# Patient Record
Sex: Male | Born: 1955 | Race: White | Hispanic: No | Marital: Married | Smoking: Never smoker
Health system: Southern US, Community
[De-identification: ages and names within clinical notes are randomized; demographics above are authoritative.]

## PROBLEM LIST (undated history)

## (undated) DIAGNOSIS — E119 Type 2 diabetes mellitus without complications: Secondary | ICD-10-CM

## (undated) DIAGNOSIS — E785 Hyperlipidemia, unspecified: Secondary | ICD-10-CM

## (undated) DIAGNOSIS — I10 Essential (primary) hypertension: Secondary | ICD-10-CM

## (undated) HISTORY — DX: Hyperlipidemia, unspecified: E78.5

## (undated) HISTORY — PX: BACK SURGERY: SHX140

## (undated) HISTORY — DX: Essential (primary) hypertension: I10

## (undated) HISTORY — PX: TONSILLECTOMY: SUR1361

## (undated) HISTORY — DX: Type 2 diabetes mellitus without complications: E11.9

## (undated) HISTORY — PX: WISDOM TOOTH EXTRACTION: SHX21

---

## 1991-08-10 HISTORY — PX: CHOLECYSTECTOMY: SHX55

## 2009-04-24 ENCOUNTER — Emergency Department (HOSPITAL_COMMUNITY): Admission: EM | Admit: 2009-04-24 | Discharge: 2009-04-24 | Payer: Self-pay | Admitting: Emergency Medicine

## 2010-11-13 LAB — COMPREHENSIVE METABOLIC PANEL
ALT: 36 U/L (ref 0–53)
AST: 41 U/L — ABNORMAL HIGH (ref 0–37)
Alkaline Phosphatase: 79 U/L (ref 39–117)
CO2: 25 mEq/L (ref 19–32)
Chloride: 104 mEq/L (ref 96–112)
GFR calc Af Amer: >60 mL/min (ref >60)
GFR calc non Af Amer: >60 mL/min (ref >60)
Potassium: 3.4 mEq/L — ABNORMAL LOW (ref 3.5–5.1)
Sodium: 138 mEq/L (ref 135–145)
Total Bilirubin: 1.1 mg/dL (ref 0.3–1.2)

## 2010-11-13 LAB — DIFFERENTIAL
Basophils Absolute: 0 10*3/uL (ref 0.0–0.1)
Basophils Relative: 0 % (ref 0–1)
Eosinophils Absolute: 0.1 K/uL (ref 0.0–0.7)
Eosinophils Relative: 1 % (ref 0–5)
Lymphocytes Relative: 18 % (ref 12–46)
Lymphs Abs: 1.3 10*3/uL (ref 0.7–4.0)
Monocytes Absolute: 0.4 10*3/uL (ref 0.1–1.0)
Monocytes Relative: 5 % (ref 3–12)
Neutro Abs: 5.6 10*3/uL (ref 1.7–7.7)
Neutrophils Relative %: 76 % (ref 43–77)

## 2010-11-13 LAB — URINALYSIS, ROUTINE W REFLEX MICROSCOPIC
Bilirubin Urine: NEGATIVE
Glucose, UA: 100 mg/dL — AB
Ketones, ur: NEGATIVE mg/dL
Leukocytes, UA: NEGATIVE
Nitrite: NEGATIVE
Protein, ur: NEGATIVE mg/dL
Specific Gravity, Urine: 1.012 (ref 1.005–1.030)
Urobilinogen, UA: 0.2 mg/dL (ref 0.0–1.0)
pH: 6.5 (ref 5.0–8.0)

## 2010-11-13 LAB — COMPREHENSIVE METABOLIC PANEL WITH GFR
Albumin: 4.5 g/dL (ref 3.5–5.2)
BUN: 13 mg/dL (ref 6–23)
Calcium: 8.7 mg/dL (ref 8.4–10.5)
Creatinine, Ser: 0.92 mg/dL (ref 0.4–1.5)
Glucose, Bld: 168 mg/dL — ABNORMAL HIGH (ref 70–99)
Total Protein: 6.5 g/dL (ref 6.0–8.3)

## 2010-11-13 LAB — CBC
HCT: 46 % (ref 39.0–52.0)
Hemoglobin: 16.5 g/dL (ref 13.0–17.0)
MCHC: 35.8 g/dL (ref 30.0–36.0)
MCV: 90.3 fL (ref 78.0–100.0)
Platelets: 203 K/uL (ref 150–400)
RBC: 5.09 MIL/uL (ref 4.22–5.81)
RDW: 12.8 % (ref 11.5–15.5)
WBC: 7.4 10*3/uL (ref 4.0–10.5)

## 2010-11-13 LAB — SAMPLE TO BLOOD BANK

## 2010-11-13 LAB — LIPASE, BLOOD: Lipase: 25 U/L (ref 11–59)

## 2010-11-13 LAB — URINE MICROSCOPIC-ADD ON

## 2011-01-26 IMAGING — CT CT ABDOMEN W/ CM
1 of 2 series · 15 of 32 positions shown, 19 images · IV contrast (agent unspecified)
Comparison: None

CT ABDOMEN

CLINICAL DATA: Lower abdominal pain.  Nausea and vomiting.
Diaphoresis.

CT ABDOMEN AND PELVIS WITH CONTRAST
TECHNIQUE: Multidetector CT imaging of the abdomen and pelvis was
performed using the standard protocol following bolus
administration of intravenous contrast.
Contrast: 100 ml Jmnipaque-ZBB

[Series 2: rtn ap with st · axial · 0.74mm/px · z∈[-140,+316]mm · 15 of 101 slices shown, 19 images]
[im 5/101  soft-tissue]
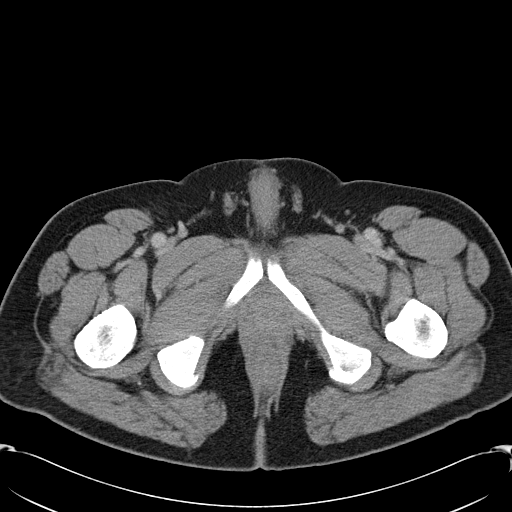
[im 5/101  bone]
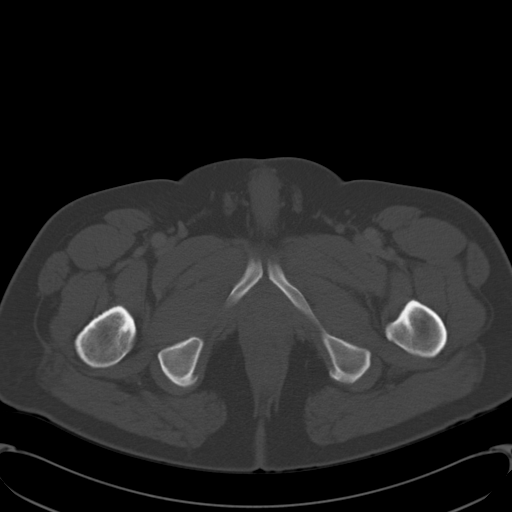
[im 14/101  soft-tissue]
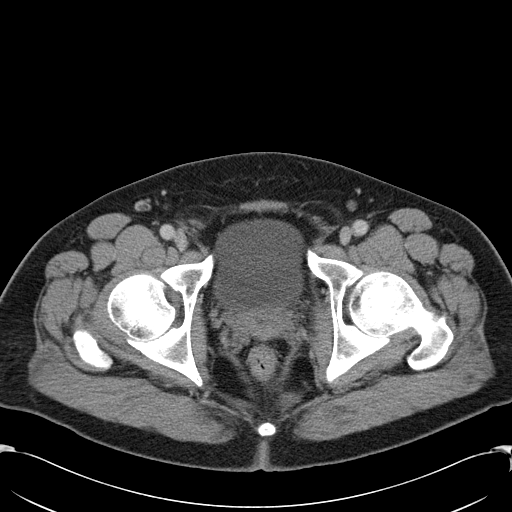
[im 23/101  soft-tissue]
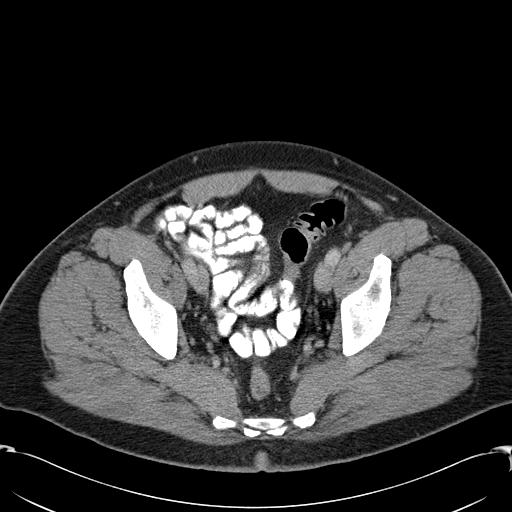
[im 28/101  soft-tissue]
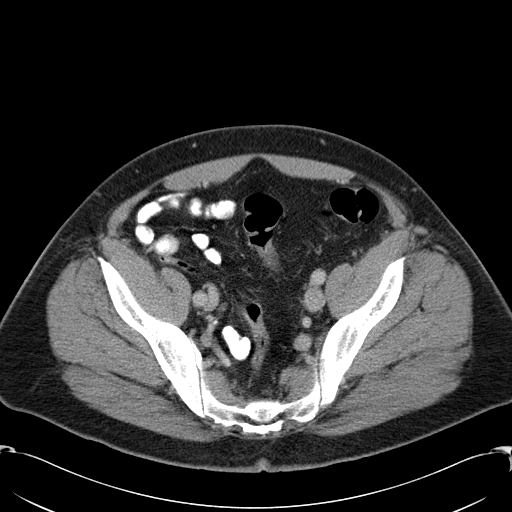
[im 37/101  soft-tissue]
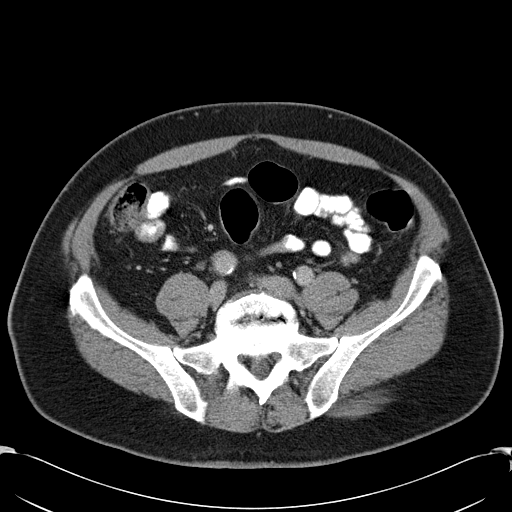
[im 41/101  soft-tissue]
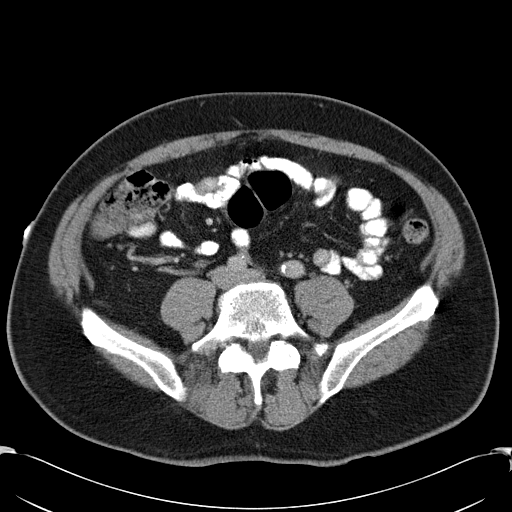
[im 51/101  soft-tissue]
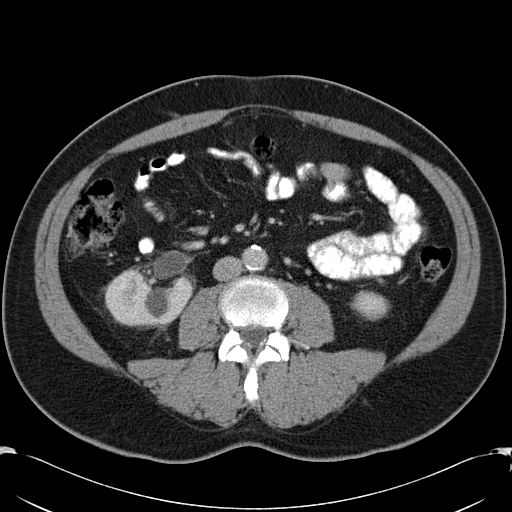
[im 60/101  soft-tissue]
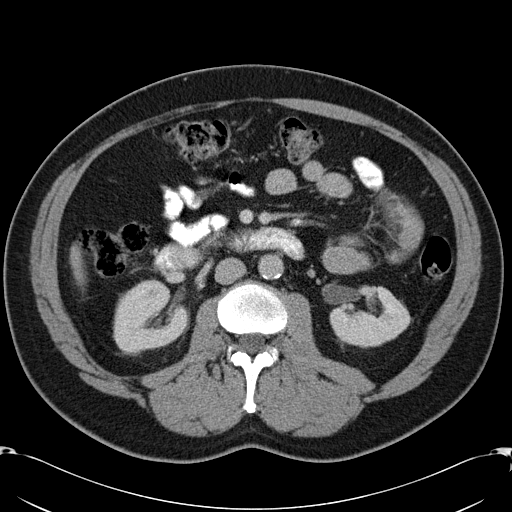
[im 64/101  soft-tissue]
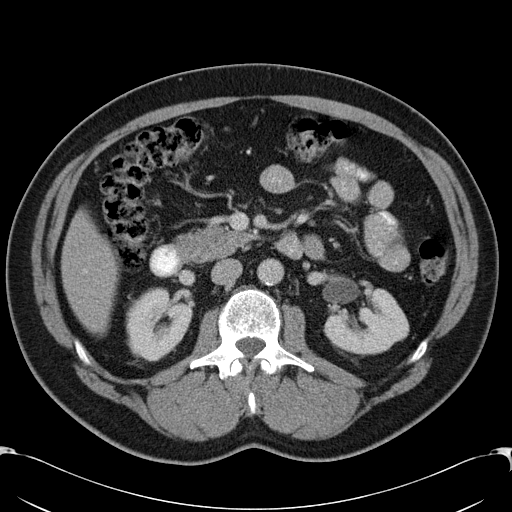
[im 64/101  bone]
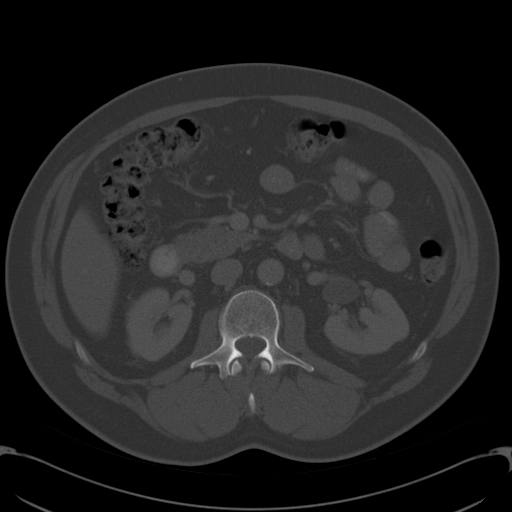
[im 73/101  soft-tissue]
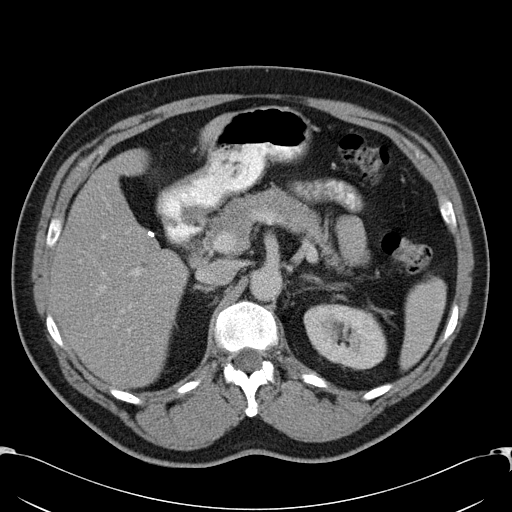
[im 78/101  soft-tissue]
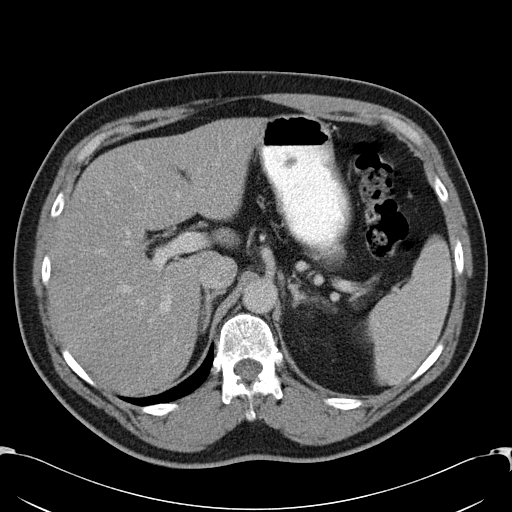
[im 82/101  lung]
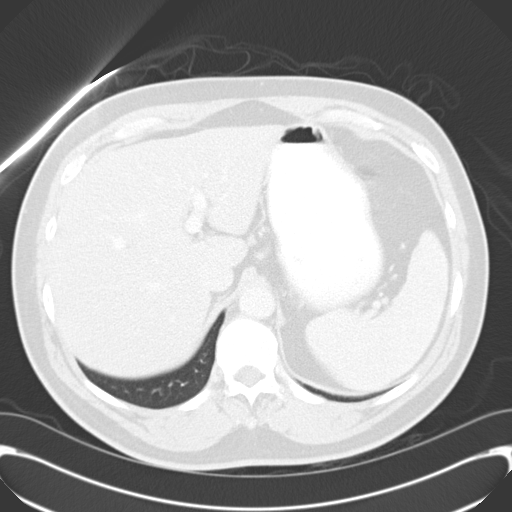
[im 87/101  soft-tissue]
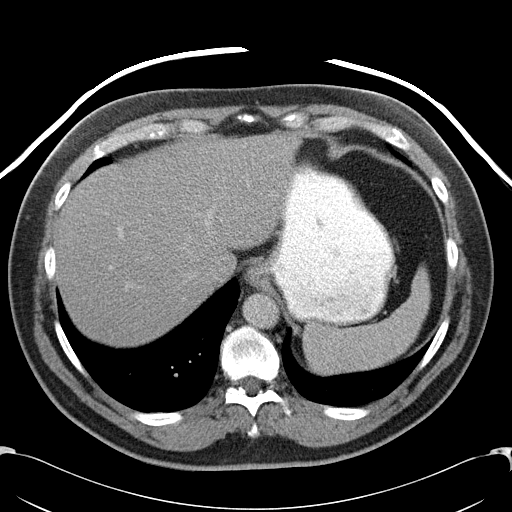
[im 87/101  lung]
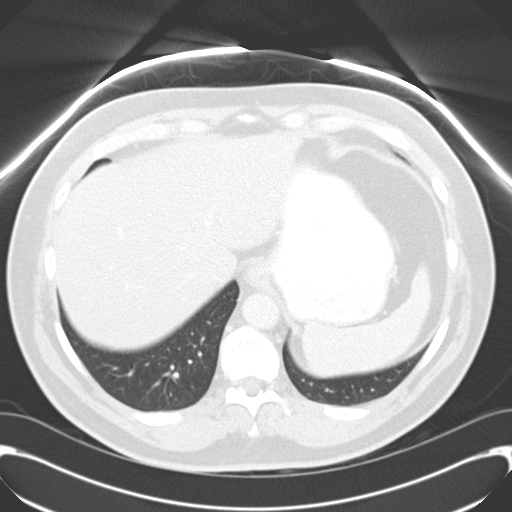
[im 91/101  lung]
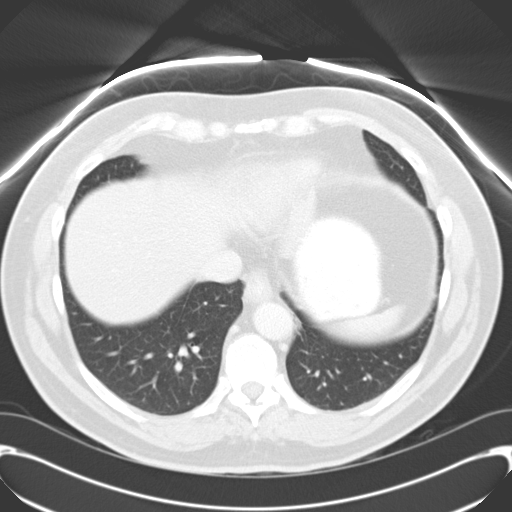
[im 96/101  soft-tissue]
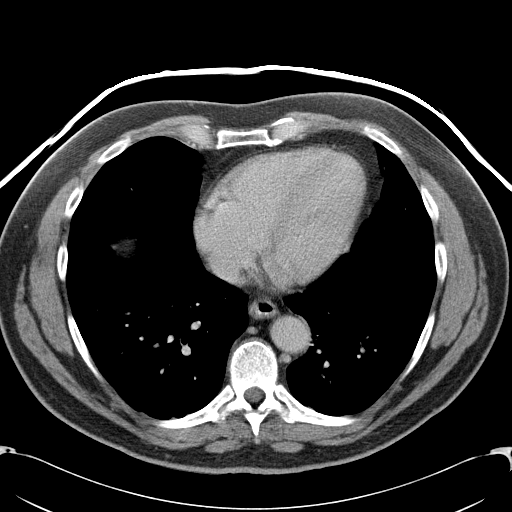
[im 96/101  lung]
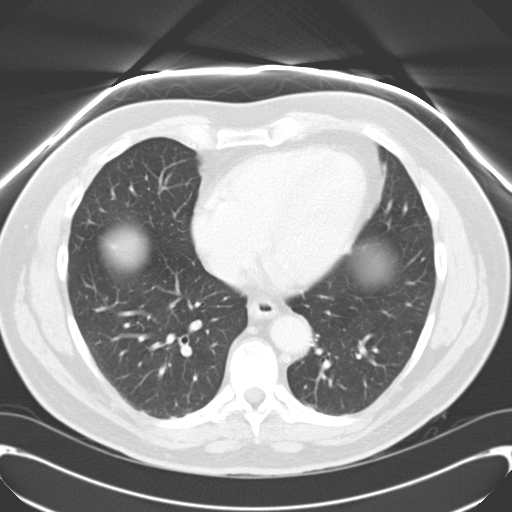

[15 of 32 positions shown; findings below may reference images not displayed]

FINDINGS: A small amount of contrast medium in the distal
esophagus suggests gastroesophageal reflux.

Mild hepatic steatosis noted.  The spleen, adrenal glands, and
pancreas appear unremarkable.  Bilateral extrarenal pelvis noted
with abnormal stranding around the right collecting system and mild
enhancement of the walls of the right renal pelvis suggesting
inflammation.  There is borderline right hydronephrosis, but
without significant abnormal renal enhancement.

A right lower pole cyst measures 2.1 x 1.7 cm on image 51 of series
#2.

There is mild right hydroureter which extends down to a 2 mm right
ureterovesical junction calculus.

No dilated bowel noted.

The gallbladder surgically absent.

No pathologic retroperitoneal or porta hepatis adenopathy is
identified.

A small umbilical hernia contains adipose tissue.
IMPRESSION: 1.  Right hydronephrosis and hydroureter extending down to a 2 mm
mildly obstructive ureterovesical junction calculus.  There is
asymmetric stranding around the right collecting system, and mild
forniceal rupture cannot be excluded.
2.  Mild hepatic steatosis.

CT PELVIS
FINDINGS: The appendix appears normal.  Mild right hydroureter
extends down to a 2 mm right ureterovesical junction calculus.

Urinary bladder appears unremarkable, as does the left ureter.

No pathologic pelvic adenopathy is identified.

No dilated bowel or discrete bowel abnormality is identified.
IMPRESSION: 1.  Mildly obstructive right sided 2 mm ureterovesical junction
calculus.

## 2013-08-07 ENCOUNTER — Encounter: Payer: Self-pay | Admitting: Internal Medicine

## 2013-08-17 ENCOUNTER — Ambulatory Visit (AMBULATORY_SURGERY_CENTER): Payer: Self-pay

## 2013-08-17 VITALS — Ht 71.0 in | Wt 214.6 lb

## 2013-08-17 DIAGNOSIS — Z800 Family history of malignant neoplasm of digestive organs: Secondary | ICD-10-CM

## 2013-08-17 MED ORDER — MOVIPREP 100 G PO SOLR: ORAL | Status: DC

## 2013-08-31 ENCOUNTER — Ambulatory Visit (AMBULATORY_SURGERY_CENTER): Payer: No Typology Code available for payment source | Admitting: Internal Medicine

## 2013-08-31 ENCOUNTER — Encounter: Payer: Self-pay | Admitting: Internal Medicine

## 2013-08-31 VITALS — BP 147/100 | HR 67 | Temp 98.1°F | Resp 16 | Ht 71.0 in | Wt 214.0 lb

## 2013-08-31 DIAGNOSIS — D126 Benign neoplasm of colon, unspecified: Secondary | ICD-10-CM

## 2013-08-31 DIAGNOSIS — Z1211 Encounter for screening for malignant neoplasm of colon: Secondary | ICD-10-CM

## 2013-08-31 DIAGNOSIS — Z800 Family history of malignant neoplasm of digestive organs: Secondary | ICD-10-CM

## 2013-08-31 MED ORDER — SODIUM CHLORIDE 0.9 % IV SOLN: 500.0000 mL | INTRAVENOUS | Status: DC

## 2013-08-31 NOTE — Progress Notes (Signed)
Procedure ends, to recovery, report given and VSS. 

## 2013-08-31 NOTE — Patient Instructions (Signed)
YOU HAD AN ENDOSCOPIC PROCEDURE TODAY AT THE [facility]: Refer to the procedure report that was given to you for any specific questions about what was found during the examination.  If the procedure report does not answer your questions, please call your gastroenterologist to clarify.  If you requested that your care partner not be given the details of your procedure findings, then the procedure report has been included in a sealed envelope for you to review at your convenience later.  YOU SHOULD EXPECT: Some feelings of bloating in the abdomen. Passage of more gas than usual.  Walking can help get rid of the air that was put into your GI tract during the procedure and reduce the bloating. If you had a lower endoscopy (such as a colonoscopy or flexible sigmoidoscopy) you may notice spotting of blood in your stool or on the toilet paper. If you underwent a bowel prep for your procedure, then you may not have a normal bowel movement for a few days.  DIET: Your first meal following the procedure should be a light meal and then it is ok to progress to your normal diet.  A half-sandwich or bowl of soup is an example of a good first meal.  Heavy or fried foods are harder to digest and may make you feel nauseous or bloated.  Likewise meals heavy in dairy and vegetables can cause extra gas to form and this can also increase the bloating.  Drink plenty of fluids but you should avoid alcoholic beverages for 24 hours.  ACTIVITY: Your care partner should take you home directly after the procedure.  You should plan to take it easy, moving slowly for the rest of the day.  You can resume normal activity the day after the procedure however you should NOT DRIVE or use heavy machinery for 24 hours (because of the sedation medicines used during the test).    SYMPTOMS TO REPORT IMMEDIATELY: A gastroenterologist can be reached at any hour.  During normal business hours, 8:30 AM to 5:00 PM Monday through Friday,  call [phone].  After hours and on weekends, please call the GI answering service at [phone] who will take a message and have the physician on call contact you.   Following lower endoscopy (colonoscopy or flexible sigmoidoscopy):  Excessive amounts of blood in the stool  Significant tenderness or worsening of abdominal pains  Swelling of the abdomen that is new, acute  Fever of 100F or higher  FOLLOW UP: If any biopsies were taken you will be contacted by phone or by letter within the next 1-3 weeks.  Call your gastroenterologist if you have not heard about the biopsies in 3 weeks.  Our staff will call the home number listed on your records the next business day following your procedure to check on you and address any questions or concerns that you may have at that time regarding the information given to you following your procedure. This is a courtesy call and so if there is no answer at the home number and we have not heard from you through the emergency physician on call, we will assume that you have returned to your regular daily activities without incident.  SIGNATURES/CONFIDENTIALITY: You and/or your care partner have signed paperwork which will be entered into your electronic medical record.  These signatures attest to the fact that that the information above on your After Visit Summary has been reviewed and is understood.  Full responsibility of the confidentiality of this   discharge information lies with you and/or your care-partner.  Recommendations Await pathology results Timing of next colonoscopy will be determined by pathology results

## 2013-08-31 NOTE — Op Note (Signed)
[facility]  [address] [place], [address]   COLONOSCOPY PROCEDURE REPORT  PATIENT: Colin Moss, Colin Moss  MR#: [id number] BIRTHDATE: [birth year] , 54  yrs. old GENDER: Male ENDOSCOPIST: Jerene Bears, MD REFERRED NK:[id number] Reynaldo Minium, M.D. PROCEDURE DATE:  08/31/2013 PROCEDURE:   Colonoscopy with cold biopsy polypectomy First Screening Colonoscopy - Avg.  risk and is 50 yrs.  old or older Yes.  Prior Negative Screening - Now for repeat screening. N/A  History of Adenoma - Now for follow-up colonoscopy & has been > or = to 3 yrs.  N/A  Polyps Removed Today? Yes. ASA CLASS:   Class II INDICATIONS:elevated risk screening, Patient's immediate family history of colon cancer, and first colonoscopy. MEDICATIONS: MAC sedation, administered by CRNA and propofol (Diprivan) 330mg  IV  DESCRIPTION OF PROCEDURE:   After the risks benefits and alternatives of the procedure were thoroughly explained, informed consent was obtained.  A digital rectal exam revealed no rectal mass.   The LB [id number]  endoscope was introduced through the anus and advanced to the terminal ileum which was intubated for a short distance. No adverse events experienced.   The quality of the prep was good, using MoviPrep  The instrument was then slowly withdrawn as the colon was fully examined.   COLON FINDINGS: The mucosa appeared normal in the terminal ileum. Three sessile polyps ranging between 3-87mm in size were found in the transverse colon (2) and rectosigmoid colon (1).  Polypectomy was performed with cold forceps.  All resections were complete and all polyp tissue was completely retrieved.  Retroflexed views revealed small external hemorrhoids. The time to cecum=4 minutes 41 seconds.  Withdrawal time=13 minutes 45 seconds.  The scope was withdrawn and the procedure completed. COMPLICATIONS: There were no complications.  ENDOSCOPIC IMPRESSION: 1.   Normal mucosa in the terminal ileum 2.    Three, small, sessile polyps ranging between 3-31mm in size were found in the transverse colon and rectosigmoid colon; Polypectomy was performed with cold forceps  RECOMMENDATIONS: 1.  Await pathology results 2.  Timing of repeat colonoscopy will be determined by pathology findings. 3.  You will receive a letter within 1-2 weeks with the results of your biopsy as well as final recommendations.  Please call my office if you have not received a letter after 3 weeks.   eSigned:  Jerene Bears, MD 08/31/2013 8:38 AM   cc: The Patient and Burnard Bunting, MD

## 2013-08-31 NOTE — Progress Notes (Signed)
Called to room to assist during endoscopic procedure.  Patient ID and intended procedure confirmed with present staff. Received instructions for my participation in the procedure from the performing physician.  

## 2013-09-03 ENCOUNTER — Telehealth: Payer: Self-pay | Admitting: *Deleted

## 2013-09-03 NOTE — Telephone Encounter (Signed)
  Follow up Call-  Call back number 08/31/2013  Post procedure Call Back phone  # [phone]  Permission to leave phone message Yes     Patient questions:  Do you have a fever, pain , or abdominal swelling? no Pain Score  0 *  Have you tolerated food without any problems? yes  Have you been able to return to your normal activities? yes  Do you have any questions about your discharge instructions: Diet   no Medications  no Follow up visit  no  Do you have questions or concerns about your Care? no  Actions: * If pain score is 4 or above: No action needed, pain <4.

## 2013-09-04 ENCOUNTER — Encounter: Payer: Self-pay | Admitting: Internal Medicine

## 2015-06-25 ENCOUNTER — Other Ambulatory Visit: Payer: Self-pay | Admitting: Physician Assistant

## 2015-06-30 ENCOUNTER — Other Ambulatory Visit: Payer: Self-pay | Admitting: Emergency Medicine

## 2015-06-30 DIAGNOSIS — J3089 Other allergic rhinitis: Secondary | ICD-10-CM

## 2015-06-30 MED ORDER — FLUTICASONE PROPIONATE 50 MCG/ACT NA SUSP: 2.0000 | Freq: Every day | NASAL | Status: DC

## 2015-06-30 NOTE — Telephone Encounter (Signed)
Med refill approved 

## 2015-06-30 NOTE — Telephone Encounter (Signed)
Received a faxed medication request from [facility] in [place].  Please advise.  Thank you.

## 2015-07-24 ENCOUNTER — Ambulatory Visit: Payer: Self-pay | Admitting: Physician Assistant

## 2015-07-24 ENCOUNTER — Encounter: Payer: Self-pay | Admitting: Physician Assistant

## 2015-07-24 DIAGNOSIS — H01006 Unspecified blepharitis left eye, unspecified eyelid: Secondary | ICD-10-CM

## 2015-07-24 MED ORDER — NEOMYCIN-POLYMYXIN-DEXAMETH 0.1 % OP OINT: 1.0000 "application " | TOPICAL_OINTMENT | Freq: Three times a day (TID) | OPHTHALMIC | Status: DC

## 2015-07-24 NOTE — Progress Notes (Signed)
S: c/o itchy on upper lid, some redness, no drainage or pain, no fever/chills/trauma  O: nad, neuro intact, left upper lid inflamed, no swelling or crusting, perrl eomi, neck supple no lymph  A: blepharitis  P: neomycin opth ointment

## 2015-10-24 ENCOUNTER — Encounter: Payer: Self-pay | Admitting: Physician Assistant

## 2015-10-24 ENCOUNTER — Ambulatory Visit: Payer: Self-pay | Admitting: Physician Assistant

## 2015-10-24 VITALS — BP 120/80 | HR 70 | Temp 98.2°F

## 2015-10-24 DIAGNOSIS — L309 Dermatitis, unspecified: Secondary | ICD-10-CM

## 2015-10-24 MED ORDER — MOMETASONE FUROATE 0.1 % EX CREA: 1.0000 "application " | TOPICAL_CREAM | Freq: Every day | CUTANEOUS | Status: DC

## 2015-10-24 NOTE — Progress Notes (Signed)
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

## 2016-07-04 ENCOUNTER — Other Ambulatory Visit: Payer: Self-pay | Admitting: Physician Assistant

## 2016-07-04 DIAGNOSIS — J3089 Other allergic rhinitis: Secondary | ICD-10-CM

## 2016-08-13 ENCOUNTER — Ambulatory Visit (INDEPENDENT_AMBULATORY_CARE_PROVIDER_SITE_OTHER): Payer: Managed Care, Other (non HMO) | Admitting: Orthopaedic Surgery

## 2016-08-13 ENCOUNTER — Encounter (INDEPENDENT_AMBULATORY_CARE_PROVIDER_SITE_OTHER): Payer: Self-pay | Admitting: Orthopaedic Surgery

## 2016-08-13 VITALS — Ht 70.5 in | Wt 199.0 lb

## 2016-08-13 DIAGNOSIS — M79605 Pain in left leg: Secondary | ICD-10-CM | POA: Diagnosis not present

## 2016-08-13 NOTE — Progress Notes (Signed)
   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 I suspect that there may been some injury to the peroneal nerve at the time of his fall when he injured his right ankle a month ago.  Plan ; continue with anti-inflammatory medicines. Avoid compression along the lateral aspect of left leg. Follow-up in 2-4 weeks if no improvement or symptoms worsen. Might consider MRI scan of left leg if no improvement to evaluate any compression of the peroneal nerve along the fibular head  Follow-Up Instructions: No Follow-up on file.   Orders:  No orders of the defined types were placed in this encounter.  No orders of the defined types were placed in this encounter.     Procedures: No procedures performed   Clinical Data: No additional findings.   Subjective: No chief complaint on file.   On 07/16/16, pt twisted right ankle. He went to see county Therapist, sports.  10 days later his left calf hurting and cramping at night. Tingling in Left great toe.  Taking advil, with some relief.   Denies any weakness with dorsiflexion or inversion of his left foot. He sustained a bruise along the anterior left tibia after the onset of his symptoms. He has experienced some pain along the lateral aspect of his left leg with tingling in the left great toe.  Review of Systems   Objective: Vital Signs: There were no vitals taken for this visit.  Physical Exam  Ortho Exam straight leg raise negative bilaterally. Positive Tinel's over the peroneal nerve approximately an inch distal to the left fibular head. No swelling. Resolving ecchymosis along the anterior tibia from a recent bruise. Decreased active  dorsiflexion of great toe compared to right without evidence of hallux rigidus. Fungal infection of left great toenail without redness. Normal strength in dorsiflexion of ankle and eversion of left foot. No pain with range of motion of left hip or left knee.  Specialty Comments:  No specialty comments available.  Imaging: No results found.   PMFS History: There are no active problems to display for this patient.  Past Medical History:  Diagnosis Date  . Hypertension     Family History  Problem Relation Age of Onset  . Ovarian cancer Mother   . Colon cancer Father   . Heart disease Father     Past Surgical History:  Procedure Laterality Date  . BACK SURGERY     ruptured disc l-4  . CHOLECYSTECTOMY  1993  . TONSILLECTOMY    . WISDOM TOOTH EXTRACTION     Social History   Occupational History  . Not on file.   Social History Main Topics  . Smoking status: Never Smoker  . Smokeless tobacco: Never Used  . Alcohol use Yes     Comment: occasional beer   . Drug use: No  . Sexual activity: Not on file

## 2017-02-14 ENCOUNTER — Ambulatory Visit: Payer: Self-pay | Admitting: Physician Assistant

## 2017-02-14 ENCOUNTER — Encounter: Payer: Self-pay | Admitting: Physician Assistant

## 2017-02-14 VITALS — BP 149/80 | HR 65 | Temp 98.5°F | Resp 16

## 2017-02-14 DIAGNOSIS — T63301A Toxic effect of unspecified spider venom, accidental (unintentional), initial encounter: Secondary | ICD-10-CM

## 2017-02-14 MED ORDER — MUPIROCIN 2 % EX OINT: TOPICAL_OINTMENT | CUTANEOUS | 0 refills | Status: DC

## 2017-02-14 NOTE — Progress Notes (Signed)
S: was working out near [place] wood [place] and noticed a bite on his leg, worried its a brown recluse spider bite, no fever/chills/spasms, been using h202 on area and antibiotic ointment  O: vitals wnl, nad, skin with nickel sized red scaly area with brown center, no drainage, no necroses, n/v intact  A: spider bite  P: bactroban ointment

## 2018-07-19 ENCOUNTER — Encounter: Payer: Self-pay | Admitting: Internal Medicine

## 2018-08-15 ENCOUNTER — Ambulatory Visit (AMBULATORY_SURGERY_CENTER): Payer: Self-pay

## 2018-08-15 VITALS — Ht 72.0 in | Wt 202.2 lb

## 2018-08-15 DIAGNOSIS — Z86010 Personal history of colonic polyps: Secondary | ICD-10-CM

## 2018-08-15 MED ORDER — NA SULFATE-K SULFATE-MG SULF 17.5-3.13-1.6 GM/177ML PO SOLN: 1.0000 | Freq: Once | ORAL | 0 refills | Status: AC

## 2018-08-15 NOTE — Progress Notes (Signed)
Denies allergies to eggs or soy products. Denies complication of anesthesia or sedation. Denies use of weight loss medication. Denies use of O2.   Emmi instructions declined.  

## 2018-08-17 ENCOUNTER — Encounter: Payer: Self-pay | Admitting: Internal Medicine

## 2018-08-25 ENCOUNTER — Encounter: Payer: Self-pay | Admitting: Internal Medicine

## 2018-08-28 ENCOUNTER — Ambulatory Visit (AMBULATORY_SURGERY_CENTER): Payer: Managed Care, Other (non HMO) | Admitting: Internal Medicine

## 2018-08-28 ENCOUNTER — Encounter: Payer: Self-pay | Admitting: Internal Medicine

## 2018-08-28 VITALS — BP 148/86 | HR 71 | Temp 97.8°F | Resp 13 | Ht 72.0 in | Wt 202.0 lb

## 2018-08-28 DIAGNOSIS — Z86010 Personal history of colonic polyps: Secondary | ICD-10-CM

## 2018-08-28 DIAGNOSIS — D123 Benign neoplasm of transverse colon: Secondary | ICD-10-CM

## 2018-08-28 MED ORDER — SODIUM CHLORIDE 0.9 % IV SOLN: 500.0000 mL | Freq: Once | INTRAVENOUS | Status: DC

## 2018-08-28 NOTE — Progress Notes (Signed)
Pt's states no medical or surgical changes since previsit or office visit. 

## 2018-08-28 NOTE — Progress Notes (Signed)
Called to room to assist during endoscopic procedure.  Patient ID and intended procedure confirmed with present staff. Received instructions for my participation in the procedure from the performing physician.  

## 2018-08-28 NOTE — Progress Notes (Signed)
A/ox3, pleased with MAC, report to RN 

## 2018-08-28 NOTE — Op Note (Signed)
[facility] Patient Name: Colin Moss Procedure Date: 08/28/2018 8:44 AM MRN: [id number] Endoscopist: Jerene Bears , MD Age: 63 Referring MD:  Date of Birth: Feb 14, 1956 Gender: Male Account #: [id number] Procedure:                Colonoscopy Indications:              Surveillance: Personal history of adenomatous                            polyps on last colonoscopy 5 years ago, Family                            history of colon cancer in a first-degree relative Medicines:                Monitored Anesthesia Care Procedure:                Pre-Anesthesia Assessment:                           - Prior to the procedure, a History and Physical                            was performed, and patient medications and                            allergies were reviewed. The patient's tolerance of                            previous anesthesia was also reviewed. The risks                            and benefits of the procedure and the sedation                            options and risks were discussed with the patient.                            All questions were answered, and informed consent                            was obtained. Prior Anticoagulants: The patient has                            taken no previous anticoagulant or antiplatelet                            agents. ASA Grade Assessment: II - A patient with                            mild systemic disease. After reviewing the risks                            and benefits, the patient was deemed in  satisfactory condition to undergo the procedure.                           After obtaining informed consent, the colonoscope                            was passed under direct vision. Throughout the                            procedure, the patient's blood pressure, pulse, and                            oxygen saturations were monitored continuously. The                            Colonoscope was  introduced through the anus and                            advanced to the terminal ileum. The colonoscopy was                            performed without difficulty. The patient tolerated                            the procedure well. The quality of the bowel                            preparation was good. The terminal ileum, ileocecal                            valve, appendiceal orifice, and rectum were                            photographed. Scope In: 8:54:57 AM Scope Out: 9:12:28 AM Scope Withdrawal Time: 0 hours 14 minutes 19 seconds  Total Procedure Duration: 0 hours 17 minutes 31 seconds  Findings:                 The digital rectal exam was normal.                           A 5 mm polyp was found in the transverse colon. The                            polyp was sessile. The polyp was removed with a                            cold snare. Resection and retrieval were complete.                           Multiple small-mouthed diverticula were found in                            the sigmoid colon.  Internal hemorrhoids were found during                            retroflexion. The hemorrhoids were small. Complications:            No immediate complications. Estimated Blood Loss:     Estimated blood loss was minimal. Impression:               - One 5 mm polyp in the transverse colon, removed                            with a cold snare. Resected and retrieved.                           - Diverticulosis in the sigmoid colon.                           - Internal hemorrhoids. Recommendation:           - Patient has a contact number available for                            emergencies. The signs and symptoms of potential                            delayed complications were discussed with the                            patient. Return to normal activities tomorrow.                            Written discharge instructions were provided to the                             patient.                           - Resume previous diet.                           - Continue present medications.                           - Await pathology results.                           - Repeat colonoscopy in 5 years for surveillance. Jerene Bears, MD 08/28/2018 9:15:52 AM This report has been signed electronically.

## 2018-08-28 NOTE — Patient Instructions (Signed)
   Information on polyps diverticulosis ,& hemorrhoids given to you today  Await pathology results   YOU HAD AN ENDOSCOPIC PROCEDURE TODAY AT [facility]:   Refer to the procedure report that was given to you for any specific questions about what was found during the examination.  If the procedure report does not answer your questions, please call your gastroenterologist to clarify.  If you requested that your care partner not be given the details of your procedure findings, then the procedure report has been included in a sealed envelope for you to review at your convenience later.  YOU SHOULD EXPECT: Some feelings of bloating in the abdomen. Passage of more gas than usual.  Walking can help get rid of the air that was put into your GI tract during the procedure and reduce the bloating. If you had a lower endoscopy (such as a colonoscopy or flexible sigmoidoscopy) you may notice spotting of blood in your stool or on the toilet paper. If you underwent a bowel prep for your procedure, you may not have a normal bowel movement for a few days.  Please Note:  You might notice some irritation and congestion in your nose or some drainage.  This is from the oxygen used during your procedure.  There is no need for concern and it should clear up in a day or so.  SYMPTOMS TO REPORT IMMEDIATELY:   Following lower endoscopy (colonoscopy or flexible sigmoidoscopy):  Excessive amounts of blood in the stool  Significant tenderness or worsening of abdominal pains  Swelling of the abdomen that is new, acute  Fever of 100F or higher    For urgent or emergent issues, a gastroenterologist can be reached at any hour by calling [phone].   DIET:  We do recommend a small meal at first, but then you may proceed to your regular diet.  Drink plenty of fluids but you should avoid alcoholic beverages for 24 hours.  ACTIVITY:  You should plan to take it easy for the rest of today and you should  NOT DRIVE or use heavy machinery until tomorrow (because of the sedation medicines used during the test).    FOLLOW UP: Our staff will call the number listed on your records the next business day following your procedure to check on you and address any questions or concerns that you may have regarding the information given to you following your procedure. If we do not reach you, we will leave a message.  However, if you are feeling well and you are not experiencing any problems, there is no need to return our call.  We will assume that you have returned to your regular daily activities without incident.  If any biopsies were taken you will be contacted by phone or by letter within the next 1-3 weeks.  Please call us at [phone] if you have not heard about the biopsies in 3 weeks.    SIGNATURES/CONFIDENTIALITY: You and/or your care partner have signed paperwork which will be entered into your electronic medical record.  These signatures attest to the fact that that the information above on your After Visit Summary has been reviewed and is understood.  Full responsibility of the confidentiality of this discharge information lies with you and/or your care-partner.

## 2018-08-29 ENCOUNTER — Telehealth: Payer: Self-pay

## 2018-08-29 NOTE — Telephone Encounter (Signed)
  Follow up Call-  Call back number 08/28/2018  Post procedure Call Back phone  # [phone]  Permission to leave phone message Yes  Some recent data might be hidden     Left message on voicemail

## 2018-08-29 NOTE — Telephone Encounter (Signed)
Left message on f/u call 

## 2018-09-06 ENCOUNTER — Encounter: Payer: Self-pay | Admitting: Internal Medicine

## 2018-09-07 ENCOUNTER — Ambulatory Visit: Payer: Self-pay | Admitting: Adult Health

## 2018-09-07 VITALS — BP 160/80 | HR 67 | Temp 97.8°F | Resp 14

## 2018-09-07 DIAGNOSIS — Z0100 Encounter for examination of eyes and vision without abnormal findings: Secondary | ICD-10-CM

## 2018-09-07 DIAGNOSIS — H02843 Edema of right eye, unspecified eyelid: Secondary | ICD-10-CM

## 2018-09-07 DIAGNOSIS — H00014 Hordeolum externum left upper eyelid: Secondary | ICD-10-CM

## 2018-09-07 DIAGNOSIS — H01001 Unspecified blepharitis right upper eyelid: Secondary | ICD-10-CM

## 2018-09-07 MED ORDER — DOXYCYCLINE HYCLATE 100 MG PO TABS: 100.0000 mg | ORAL_TABLET | Freq: Two times a day (BID) | ORAL | 0 refills | Status: DC

## 2018-09-07 MED ORDER — BACITRACIN-POLYMYXIN B 500-10000 UNIT/GM OP OINT: 1.0000 "application " | TOPICAL_OINTMENT | Freq: Three times a day (TID) | OPHTHALMIC | 0 refills | Status: AC

## 2018-09-07 NOTE — Patient Instructions (Addendum)
Please follow up with your eye doctor or your primary care physician if and symptoms change or worsen. Advised patient call the office or your primary care doctor for an appointment if no improvement within 72 hours or if any symptoms change or worsen at any time  Advised ER or urgent Care if after hours or on weekend. Call 911 for emergency symptoms at any time.Patinet verbalized understanding of all instructions given/reviewed and treatment plan and has no further questions or concerns at this time.    Blepharitis Blepharitis is swelling of the eyelids. Symptoms may include:  Reddish, scaly skin around the scalp and eyebrows.  Burning or itching of the eyelids.  Fluid coming from the eye at night. This causes the eyelashes to stick together in the morning.  Eyelashes that fall out.  Being sensitive to light. Follow these instructions at home: Pay attention to any changes in how you look or feel. Tell your health care provider about any changes. Follow these instructions to help with your condition: Keeping clean   Wash your hands often.  Wash your eyelids with warm water, or wash them with warm water that is mixed with little bit of baby shampoo. Do this 2 or more times per day.  Wash your face and eyebrows at least once a day.  Use a clean towel each time you dry your eyelids. Do not use the towel to clean or dry other areas of your body. Do not share your towel with anyone. General instructions  Avoid wearing makeup until you get better. Do not share makeup with anyone.  Avoid rubbing your eyes.  Put a warm compress on your eyes 2 times per day for 10 minutes at a time, or as told by your doctor.  If you were given antibiotics in the form of creams or eye drops, use the medicine as told by your doctor. Do not stop using the medicine even if you feel better.  Keep all follow-up visits as told by your doctor. This is important. Contact a doctor if:  Your eyelids feel  hot.  You have blisters on your eyelids.  You have a rash on your eyelids.  The swelling does not go away in 2-4 days.  The swelling gets worse. Get help right away if:  You have pain that gets worse.  You have pain that spreads to other parts of your face.  You have redness that gets worse.  You have redness that spreads to other parts of your face.  Your vision changes.  You have pain when you look at lights or things that move.  You have a fever. Summary  Blepharitis is swelling of the eyelids.  Pay attention to any changes in how your eyes look or feel. Tell your doctor about any changes.  Follow home care instructions as told by your doctor. Wash your hands often. Avoid wearing makeup. Do not rub your eyes.  Use warm compresses, creams, or eye drops as told by your doctor.  Let your doctor know if you have changes in vision, blisters or rash on eyelids, pain that spreads to your face, or warmth on your eyelids. This information is not intended to replace advice given to you by your health care provider. Make sure you discuss any questions you have with your health care provider. Document Released: 05/04/2008 Document Revised: 01/23/2018 Document Reviewed: 01/23/2018 Elsevier Interactive Patient Education  2019 [place]  A chalazion is a swelling or lump on the eyelid.  It can affect the upper eyelid or the lower eyelid. What are the causes? This condition may be caused by:  Long-lasting (chronic) inflammation of the eyelid glands.  A blocked oil gland in the eyelid. What are the signs or symptoms? Symptoms of this condition include:  Swelling of the eyelid. The swelling may spread to areas around the eye.  A hard lump on the eyelid.  Blurry vision. The lump on the eyelid may make it hard to see out of the eye. How is this diagnosed? This condition is diagnosed with an examination of the eye. How is this treated? This condition is treated  by applying a warm compress to the eyelid. If the condition does not improve, it may be treated with:  Medicine that is injected into the chalazion by a health care provider.  Surgery.  Medicine that is applied to the eye. Follow these instructions at home: Managing pain and swelling  Apply a warm, moist compress to the eyelid 4-6 times a day for 10-15 minutes at a time. This will help to open any blocked glands and to reduce redness and swelling.  Apply over-the-counter and prescription medicines only as told by your health care provider. General instructions  Do not touch the chalazion.  Do not try to remove the pus. Do not squeeze the chalazion or stick it with a pin or needle.  Do not rub your eyes.  Wash your hands often. Dry your hands with a clean towel.  Keep your face, scalp, and eyebrows clean.  Avoid wearing eye makeup.  If the chalazion does not break open (rupture) on its own, return to your health care provider.  Keep all follow-up appointments as told by your health care provider. This is important. Contact a health care provider if:  Your eyelid has not improved in 4 weeks.  Your eyelid is getting worse.  You have a fever.  The chalazion does not rupture on its own after a month of home treatment. Get help right away if:  You have pain in your eye.  Your vision changes.  The chalazion becomes painful or red.  The chalazion gets bigger. Summary  A chalazion is a swelling or lump on the upper or lower eyelid.  It may be caused by chronic inflammation or a blocked oil gland.  Apply a warm, moist compress to the eyelid 4-6 times a day for 10-15 minutes at a time.  Keep your face, scalp, and eyebrows clean. This information is not intended to replace advice given to you by your health care provider. Make sure you discuss any questions you have with your health care provider. Document Released: 07/23/2000 Document Revised: 01/12/2018 Document  Reviewed: 01/12/2018 Elsevier Interactive Patient Education  2019 [place].  Doxycycline tablets or capsules What is this medicine? DOXYCYCLINE (dox i SYE kleen) is a tetracycline antibiotic. It kills certain bacteria or stops their growth. It is used to treat many kinds of infections, like dental, skin, respiratory, and urinary tract infections. It also treats acne, Lyme disease, malaria, and certain sexually transmitted infections. This medicine may be used for other purposes; ask your health care provider or pharmacist if you have questions. COMMON BRAND NAME(S): Acticlate, Adoxa, Adoxa CK, Adoxa Pak, Adoxa TT, Alodox, Avidoxy, Doxal, LYMEPAK, Mondoxyne NL, Monodox, Morgidox 1x, Morgidox 1x Kit, Morgidox 2x, Morgidox 2x Kit, NutriDox, Ocudox, TARGADOX, Vibra-Tabs, Vibramycin What should I tell my health care provider before I take this medicine? They need to know if you have any of  these conditions: -liver disease -long exposure to sunlight like working outdoors -stomach problems like colitis -an unusual or allergic reaction to doxycycline, tetracycline antibiotics, other medicines, foods, dyes, or preservatives -pregnant or trying to get pregnant -breast-feeding How should I use this medicine? Take this medicine by mouth with a full glass of water. Follow the directions on the prescription label. It is best to take this medicine without food, but if it upsets your stomach take it with food. Take your medicine at regular intervals. Do not take your medicine more often than directed. Take all of your medicine as directed even if you think you are better. Do not skip doses or stop your medicine early. Talk to your pediatrician regarding the use of this medicine in children. While this drug may be prescribed for selected conditions, precautions do apply. Overdosage: If you think you have taken too much of this medicine contact a poison control center or emergency room at once. NOTE: This  medicine is only for you. Do not share this medicine with others. What if I miss a dose? If you miss a dose, take it as soon as you can. If it is almost time for your next dose, take only that dose. Do not take double or extra doses. What may interact with this medicine? -antacids -barbiturates -birth control pills -bismuth subsalicylate -carbamazepine -methoxyflurane -other antibiotics -phenytoin -vitamins that contain iron -warfarin This list may not describe all possible interactions. Give your health care provider a list of all the medicines, herbs, non-prescription drugs, or dietary supplements you use. Also tell them if you smoke, drink alcohol, or use illegal drugs. Some items may interact with your medicine. What should I watch for while using this medicine? Tell your doctor or health care professional if your symptoms do not improve. Do not treat diarrhea with over the counter products. Contact your doctor if you have diarrhea that lasts more than 2 days or if it is severe and watery. Do not take this medicine just before going to bed. It may not dissolve properly when you lay down and can cause pain in your throat. Drink plenty of fluids while taking this medicine to also help reduce irritation in your throat. This medicine can make you more sensitive to the sun. Keep out of the sun. If you cannot avoid being in the sun, wear protective clothing and use sunscreen. Do not use sun lamps or tanning beds/booths. Birth control pills may not work properly while you are taking this medicine. Talk to your doctor about using an extra method of birth control. If you are being treated for a sexually transmitted infection, avoid sexual contact until you have finished your treatment. Your sexual partner may also need treatment. Avoid antacids, aluminum, calcium, magnesium, and iron products for 4 hours before and 2 hours after taking a dose of this medicine. If you are using this medicine to  prevent malaria, you should still protect yourself from contact with mosquitos. Stay in screened-in areas, use mosquito nets, keep your body covered, and use an insect repellent. What side effects may I notice from receiving this medicine? Side effects that you should report to your doctor or health care professional as soon as possible: -allergic reactions like skin rash, itching or hives, swelling of the face, lips, or tongue -difficulty breathing -fever -itching in the rectal or genital area -pain on swallowing -redness, blistering, peeling or loosening of the skin, including inside the mouth -severe stomach pain or cramps -unusual bleeding or bruising -  unusually weak or tired -yellowing of the eyes or skin Side effects that usually do not require medical attention (report to your doctor or health care professional if they continue or are bothersome): -diarrhea -loss of appetite -nausea, vomiting This list may not describe all possible side effects. Call your doctor for medical advice about side effects. You may report side effects to FDA at 1-800-FDA-1088. Where should I keep my medicine? Keep out of the reach of children. Store at room temperature, below 30 degrees C (86 degrees F). Protect from light. Keep container tightly closed. Throw away any unused medicine after the expiration date. Taking this medicine after the expiration date can make you seriously ill. NOTE: This sheet is a summary. It may not cover all possible information. If you have questions about this medicine, talk to your doctor, pharmacist, or health care provider.  2019 Elsevier/Gold Standard (2015-08-27 17:11:22)

## 2018-09-07 NOTE — Progress Notes (Signed)
[facility]  Subjective:     Patient ID: Colin Moss, male   DOB: 02-08-1956, 63 y.o.   MRN: [id number]   Blood pressure (!) 160/80, pulse 67, temperature 97.8 F (36.6 C), temperature source Oral, resp. rate 14, SpO2 97 %.  Patient is a 63 year old male in no acute distress who comes to the clinic with complaint of   Eye Problem   The right eye is affected. This is a new problem. The current episode started yesterday (last night was irritated ). The problem has been unchanged. The pain is at a severity of 0/10. Pain severity now: rritation right upper eyelid  There is no known exposure to pink eye. He does not wear contacts. Associated symptoms include eye redness (upper right eye lid ) and itching (right upper lid ). Pertinent negatives include no blurred vision, eye discharge, double vision, fever, foreign body sensation, nausea, photophobia, recent URI or vomiting. He has tried nothing for the symptoms. The treatment provided no relief.   Denies any injury.  20/30 both eyes. 20/40 left eye. 20/50 right eye( swollen eyelid impeding view) patient denies any change from normal if he lifts his eye lid.  Denies any pain in the eye. Eyelid irritation.  Denies any eye discharge.  Denies any trauma or chemosis to eye.    Patient  denies any fever, body aches,chills, rash, chest pain, shortness of breath, nausea, vomiting, or diarrhea.    Allergies  Allergen Reactions  . Betadine [Povidone Iodine]     rash  . Erythromycin     hives    Past Medical History:  Diagnosis Date  . Diabetes mellitus without complication ([place])   . Hyperlipidemia   . Hypertension     Current Outpatient Medications:  .  cholestyramine (QUESTRAN) 4 GM/DOSE powder, , Disp: , Rfl:  .  diclofenac (VOLTAREN) 50 MG EC tablet, , Disp: , Rfl:  .  fluticasone (FLONASE) 50 MCG/ACT nasal spray, PLACE 2 SPRAYS INTO BOTH NOSTRILS DAILY., Disp: 16 g, Rfl: 2 .  ibuprofen  (ADVIL,MOTRIN) 200 MG tablet, Take 200 mg by mouth every 6 (six) hours as needed., Disp: , Rfl:  .  losartan-hydrochlorothiazide (HYZAAR) 50-12.5 MG tablet, Take 1 tablet by mouth 2 (two) times daily., Disp: , Rfl: 11 .  metFORMIN (GLUCOPHAGE) 500 MG tablet, Take 500 mg by mouth 2 (two) times daily., Disp: , Rfl: 5 .  mometasone (ELOCON) 0.1 % cream, Apply 1 application topically daily., Disp: 45 g, Rfl: 3 .  Multiple Vitamin (ONE-A-DAY MENS PO), Take by mouth., Disp: , Rfl:  .  mupirocin ointment (BACTROBAN) 2 %, Apply to area bid, Disp: 22 g, Rfl: 0 .  SUPREP BOWEL PREP KIT 17.5-3.13-1.6 GM/177ML SOLN, , Disp: , Rfl:     Review of Systems  Constitutional: Negative for activity change, appetite change, chills, diaphoresis, fatigue, fever and unexpected weight change.  HENT: Negative.   Eyes: Positive for redness (upper right eye lid ) and itching (right upper lid ). Negative for blurred vision, double vision, photophobia, pain, discharge and visual disturbance (denies visual changes but does feel like eyelid swelling impeding view.).       Right upper eye lid swelling.   Respiratory: Negative.   Cardiovascular: Negative.   Gastrointestinal: Negative.  Negative for nausea and vomiting.  Genitourinary: Negative.   Musculoskeletal: Negative.   Skin: Positive for color change (red skin right eyelid and surronding skin). Negative for pallor, rash and wound.  Neurological: Negative.   Psychiatric/Behavioral: Negative.        Objective:   Physical Exam Vitals signs reviewed.  Constitutional:      General: He is not in acute distress.    Appearance: Normal appearance. He is normal weight. He is not ill-appearing, toxic-appearing or diaphoretic.     Comments: Patient moves on and off of exam table and in room without difficulty. Gait is normal in hall and in room. Patient is oriented to person place time and situation. Patient answers questions appropriately and engages in conversation.     HENT:     Head: Normocephalic and atraumatic.     Nose: Nose normal. No congestion or rhinorrhea.     Mouth/Throat:     Mouth: Mucous membranes are moist.  Eyes:     General: Lids are everted, no foreign bodies appreciated. Vision grossly intact. Gaze aligned appropriately. No allergic shiner, visual field deficit or scleral icterus.       Right eye: Hordeolum (right upper lid / marked in green on diagram ) present. No foreign body or discharge.        Left eye: No foreign body, discharge or hordeolum.     Extraocular Movements: Extraocular movements intact.     Conjunctiva/sclera: Conjunctivae normal.     Pupils: Pupils are equal, round, and reactive to light.     Right eye: Pupil is not sluggish.     Left eye: Pupil is not sluggish.     Visual Fields: Right eye visual fields normal and left eye visual fields normal.      Comments: Right upper eyelid mild to moderately swollen. Hordeolum noted as marked on diagram. Also dry crusty skin with erythema along eyelash border.  No bony tenderness. Erythema spreading entire right upper eyelid to scantly  just beyond right eye as drawn on diagram in black. No bony tenderness.    Neck:     Musculoskeletal: Normal range of motion and neck supple. No neck rigidity or muscular tenderness.  Cardiovascular:     Rate and Rhythm: Normal rate and regular rhythm.  Musculoskeletal: Normal range of motion.  Lymphadenopathy:     Cervical: No cervical adenopathy.  Skin:    General: Skin is warm and dry.     Capillary Refill: Capillary refill takes less than 2 seconds.  Neurological:     General: No focal deficit present.     Mental Status: He is alert.     Cranial Nerves: No cranial nerve deficit.     Sensory: No sensory deficit.     Gait: Gait normal.     Comments: Patient moves on and off exam table without difficulty. Gait is sure and steady in hall and in room.    Psychiatric:        Mood and Affect: Mood normal.        Behavior: Behavior  normal.        Thought Content: Thought content normal.        Judgment: Judgment normal.        Assessment:     Eye exam, routine - Plan: Visual acuity screening  Blepharitis of right upper eyelid, unspecified type  Swelling of eyelid, right  Hordeolum externum of left upper eyelid      Plan:     Meds ordered this encounter  Medications  . bacitracin-polymyxin b (POLYSPORIN) ophthalmic ointment    Sig: Place 1 application into the right eye 3 (three) times daily for 10 days.  Dispense:  7 g    Refill:  0  . doxycycline (VIBRA-TABS) 100 MG tablet    Sig: Take 1 tablet (100 mg total) by mouth 2 (two) times daily.    Dispense:  14 tablet    Refill:  0   Recommend yearly eye exam. Please follow up with your eye doctor or your primary care physician if and symptoms change or worsen at anytime.  Gave and reviewed After Visit Summary(AVS) with patient.  Discussed no redness should spread, no bony tenderness, vision changes or any symptoms should worsen. If worsens or RED FLAG symptoms as discussed seek medical attention immediately.     Advised patient call the office or your primary care doctor for an appointment if no improvement within 72 hours or if any symptoms change or worsen at any time  Advised ER or urgent Care if after hours or on weekend. Call 911 for emergency symptoms at any time.Patinet verbalized understanding of all instructions given/reviewed and treatment plan and has no further questions or concerns at this time.    Patient verbalized understanding of all instructions given and denies any further questions at this time.

## 2018-09-19 ENCOUNTER — Encounter: Payer: Self-pay | Admitting: Internal Medicine

## 2018-10-06 ENCOUNTER — Encounter: Payer: Self-pay | Admitting: Internal Medicine

## 2024-05-01 ENCOUNTER — Encounter: Payer: Self-pay | Admitting: Internal Medicine

## 2024-08-17 ENCOUNTER — Encounter: Payer: Self-pay | Admitting: Internal Medicine

## 2024-09-13 ENCOUNTER — Ambulatory Visit

## 2024-09-13 VITALS — Ht 72.0 in | Wt 181.0 lb

## 2024-09-13 DIAGNOSIS — Z800 Family history of malignant neoplasm of digestive organs: Secondary | ICD-10-CM

## 2024-09-13 DIAGNOSIS — Z860100 Personal history of colon polyps, unspecified: Secondary | ICD-10-CM

## 2024-09-13 MED ORDER — NA SULFATE-K SULFATE-MG SULF 17.5-3.13-1.6 GM/177ML PO SOLN: 1.0000 | Freq: Once | ORAL | 0 refills | Status: AC

## 2024-09-13 NOTE — Progress Notes (Signed)
 RN confirmed patient name, date of birth, and address RN confirmed date and time of procedure RN reviewed and confirmed allergies  RN reviewed and updated current medications; confirmed preferred pharmacy Pt is not on diet pills nor GLP-1 medications Pt is not on blood thinners RN reviewed medical & surgical hx  Pt denies issues with chronic constipation  Diabetic - yes No A fib or A flutter No cardiac tests are pending  Pt is not on home 02  No issues known with past sedation with any surgeries or procedures Patient denies ever being told they had issues or difficulty with intubation  Patient unaware of any fh of malignant hyperthermia Ambulates independently RN reviewed prep instructions and explained time frames for holding certain medications RN answered patient questions; patient stated understanding Prep instructions sent via mail per patient request

## 2024-09-27 ENCOUNTER — Encounter: Admitting: Internal Medicine
